# Patient Record
Sex: Male | Born: 1990 | Race: Black or African American | Hispanic: No | Marital: Single | Smoking: Current every day smoker
Health system: Southern US, Community
[De-identification: ages and names within clinical notes are randomized; demographics above are authoritative.]

---
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
## 2012-08-14 ENCOUNTER — Emergency Department (HOSPITAL_COMMUNITY)
Admission: EM | Admit: 2012-08-14 | Discharge: 2012-08-14 | Disposition: A | Discharge status: Home / Self Care | Payer: Self-pay | Attending: Emergency Medicine | Admitting: Emergency Medicine

## 2012-08-14 ENCOUNTER — Emergency Department (HOSPITAL_COMMUNITY): Payer: Self-pay

## 2012-08-14 ENCOUNTER — Encounter (HOSPITAL_COMMUNITY): Payer: Self-pay | Admitting: Emergency Medicine

## 2012-08-14 DIAGNOSIS — Z79899 Other long term (current) drug therapy: Secondary | ICD-10-CM | POA: Insufficient documentation

## 2012-08-14 DIAGNOSIS — Y929 Unspecified place or not applicable: Secondary | ICD-10-CM | POA: Insufficient documentation

## 2012-08-14 DIAGNOSIS — Y9367 Activity, basketball: Secondary | ICD-10-CM | POA: Insufficient documentation

## 2012-08-14 DIAGNOSIS — S92302A Fracture of unspecified metatarsal bone(s), left foot, initial encounter for closed fracture: Secondary | ICD-10-CM

## 2012-08-14 DIAGNOSIS — X500XXA Overexertion from strenuous movement or load, initial encounter: Secondary | ICD-10-CM | POA: Insufficient documentation

## 2012-08-14 DIAGNOSIS — S92309A Fracture of unspecified metatarsal bone(s), unspecified foot, initial encounter for closed fracture: Secondary | ICD-10-CM | POA: Insufficient documentation

## 2012-08-14 DIAGNOSIS — F17200 Nicotine dependence, unspecified, uncomplicated: Secondary | ICD-10-CM | POA: Insufficient documentation

## 2012-08-14 MED ORDER — OXYCODONE-ACETAMINOPHEN 5-325 MG PO TABS: 2.0000 | ORAL_TABLET | Freq: Four times a day (QID) | ORAL | Status: AC | PRN

## 2012-08-14 MED ORDER — ONDANSETRON 8 MG PO TBDP
8.0000 mg | ORAL_TABLET | Freq: Once | ORAL | Status: AC
Start: 2012-08-14 — End: 2012-08-14
  Administered 2012-08-14: 8 mg via ORAL
  Filled 2012-08-14: qty 1

## 2012-08-14 MED ORDER — PROMETHAZINE HCL 25 MG PO TABS: 25.0000 mg | ORAL_TABLET | Freq: Four times a day (QID) | ORAL | Status: AC | PRN

## 2012-08-14 MED ORDER — OXYCODONE-ACETAMINOPHEN 5-325 MG PO TABS
2.0000 | ORAL_TABLET | Freq: Once | ORAL | Status: AC
Start: 2012-08-14 — End: 2012-08-14
  Administered 2012-08-14: 2 via ORAL
  Filled 2012-08-14: qty 2

## 2012-08-14 NOTE — ED Provider Notes (Signed)
Medical screening examination/treatment/procedure(s) were performed by non-physician practitioner and as supervising physician I was immediately available for consultation/collaboration.  Cesareo Vickrey, MD 08/14/12 2323 

## 2012-08-14 NOTE — ED Provider Notes (Signed)
History    This chart was scribed for Junious Silk, PA working with Geoffery Lyons, MD by ED Scribe, Burman Nieves. This patient was seen in room WTR6/WTR6 and the patient's care was started at 6:46 PM.   CSN: [id number]  Arrival date & time 08/14/12  2130   First MD Initiated Contact with Patient 08/14/12 1846      Chief Complaint  Patient presents with  . Foot Pain    (Consider location/radiation/quality/duration/timing/severity/associated sxs/prior treatment) Patient is a 22 y.o. male presenting with lower extremity pain. The history is provided by the patient. No language interpreter was used.  Foot Pain   HPI Comments: Micheal Christensen is a 22 y.o. male who presents to the Emergency Department complaining of moderate constant right foot pain with associated swelling which occurred yesterday. Pt was playing basketball yesterday when he was attempting a crossover when another person stepped on his right foot causing him to twist it. Pt woke up this morning with progressing swelling and pain. Pt states he heard a pop and pain is exacerbated upon ambulation. There is evident swelling in his right foot and any weight bearing activity exacerbates pain as well. Pt denies fever, chills, cough, nausea, vomiting, diarrhea, SOB, weakness, and any other associated symptoms. Pt states he is a current tobacco smoker. Pt's current PCP is Dr. Cristal Ford.   History reviewed. No pertinent past medical history.  History reviewed. No pertinent past surgical history.  No family history on file.  History  Substance Use Topics  . Smoking status: Current Every Day Smoker  . Smokeless tobacco: Not on file  . Alcohol Use: Yes      Review of Systems  Musculoskeletal: Positive for myalgias, joint swelling and arthralgias.  All other systems reviewed and are negative.    Allergies  Review of patient's allergies indicates no known allergies.  Home Medications   Current Outpatient Rx  Name  Route   Sig  Dispense  Refill  . methylphenidate (CONCERTA) 36 MG CR tablet   Oral   Take 36 mg by mouth daily.           . methylphenidate (RITALIN) 10 MG tablet   Oral   Take 10 mg by mouth daily at 12 noon.             BP 132/67  Temp(Src) 98.2 F (36.8 C)  Resp 18  SpO2 98%  Physical Exam  Nursing note and vitals reviewed. Constitutional: He is oriented to person, place, and time. He appears well-developed and well-nourished. No distress.  HENT:  Head: Normocephalic and atraumatic.  Right Ear: External ear normal.  Left Ear: External ear normal.  Nose: Nose normal.  Eyes: Conjunctivae are normal. Pupils are equal, round, and reactive to light.  Neck: Normal range of motion. No tracheal deviation present.  Cardiovascular: Normal rate, regular rhythm and normal heart sounds.   Pulmonary/Chest: Effort normal and breath sounds normal. No stridor.  Abdominal: Soft. He exhibits no distension. There is no tenderness.  Musculoskeletal: Normal range of motion. He exhibits tenderness.  Small effusion on the lateral side of his right foot. Tenderness to palpation to the lateral side of his right foot. Neurovascularly intact.   Neurological: He is alert and oriented to person, place, and time.  Skin: Skin is warm and dry. He is not diaphoretic.  Psychiatric: He has a normal mood and affect. His behavior is normal.    ED Course  Procedures (including critical care time) DIAGNOSTIC STUDIES: Oxygen Saturation  is 98% on room air, normal by my interpretation.    COORDINATION OF CARE:  7:45 PM Discussed ED treatment with pt and pt agrees. Advised pt to contact current PCP for further evaluation of broken foot.     Labs Reviewed - No data to display Dg Foot Complete Right  08/14/2012   *RADIOLOGY REPORT*  Clinical Data: Foot pain.  Stepped on 1 day ago.  RIGHT FOOT COMPLETE - 3+ VIEW  Comparison: None.  Findings: There is an acute, incomplete fracture through the lateral cortex of the  proximal shaft of the fifth metatarsal. There is adjacent soft tissue swelling.  The joints of the foot are aligned.  No additional fractures are identified.  IMPRESSION: Nondisplaced and incomplete fracture of the proximal shaft of the fifth metatarsal, with adjacent soft tissue swelling.   Original Report Authenticated By: Britta Mccreedy, M.D.     1. Fracture of fifth metatarsal bone, left, closed, initial encounter       MDM  Patient presents with an incomplete fracture of the fifth metatarsal bone. He was given crutches and a post op shoe. Follow up with your pcp to ensure the bone is healing. Work note given per patient request. Compartments soft, neurovascularly intact. Return precautions given. Vital signs stable for discharge. Patient / Family / Caregiver informed of clinical course, understand medical decision-making process, and agree with plan.      I personally performed the services described in this documentation, which was scribed in my presence. The recorded information has been reviewed and is accurate.     Mora Bellman, PA-C 08/14/12 2300

## 2012-08-14 NOTE — ED Notes (Signed)
Per pt, was playing basketball, someone stepped on foot-woke up this am with increased pain and swelling-unable to bear weight

## 2014-02-15 ENCOUNTER — Encounter (HOSPITAL_COMMUNITY): Payer: Self-pay | Admitting: Emergency Medicine

## 2014-02-15 ENCOUNTER — Emergency Department (HOSPITAL_COMMUNITY): Payer: Medicaid Other

## 2014-02-15 ENCOUNTER — Emergency Department (HOSPITAL_COMMUNITY)
Admission: EM | Admit: 2014-02-15 | Discharge: 2014-02-15 | Disposition: A | Discharge status: Home / Self Care | Payer: Medicaid Other | Attending: Emergency Medicine | Admitting: Emergency Medicine

## 2014-02-15 DIAGNOSIS — S8990XA Unspecified injury of unspecified lower leg, initial encounter: Secondary | ICD-10-CM

## 2014-02-15 DIAGNOSIS — Y92410 Unspecified street and highway as the place of occurrence of the external cause: Secondary | ICD-10-CM | POA: Insufficient documentation

## 2014-02-15 DIAGNOSIS — Z720 Tobacco use: Secondary | ICD-10-CM | POA: Insufficient documentation

## 2014-02-15 DIAGNOSIS — Y9389 Activity, other specified: Secondary | ICD-10-CM | POA: Insufficient documentation

## 2014-02-15 DIAGNOSIS — S83511A Sprain of anterior cruciate ligament of right knee, initial encounter: Secondary | ICD-10-CM | POA: Insufficient documentation

## 2014-02-15 DIAGNOSIS — S8391XA Sprain of unspecified site of right knee, initial encounter: Secondary | ICD-10-CM

## 2014-02-15 DIAGNOSIS — Y998 Other external cause status: Secondary | ICD-10-CM | POA: Insufficient documentation

## 2014-02-15 NOTE — Discharge Instructions (Signed)
Please call your doctor for a followup appointment within 24-48 hours. When you talk to your doctor please let them know that you were seen in the emergency department and have them acquire all of your records so that they can discuss the findings with you and formulate a treatment plan to fully care for your new and ongoing problems. Please call and set-up an appointment with your primary care provider to be seen and reassessed Please call and set up an appointment with orthopedics Please apply knee sleeve and keep on at all times Please rest, ice, elevate - toes above nose Please massage Please continue to monitor symptoms closely and if symptoms are to worsen or change (fever greater than 101, chills, sweating, nausea, vomiting, chest pain, shortness of breathe, difficulty breathing, weakness, numbness, tingling, worsening or changes to pain pattern, Fall, injury, swelling, redness, red streaks, loss of sensation, weakness to the leg) please report back to the Emergency Department immediately.   Knee Pain The knee is the complex joint between your thigh and your lower leg. It is made up of bones, tendons, ligaments, and cartilage. The bones that make up the knee are:  The femur in the thigh.  The tibia and fibula in the lower leg.  The patella or kneecap riding in the groove on the lower femur. CAUSES  Knee pain is a common complaint with many causes. A few of these causes are:  Injury, such as:  A ruptured ligament or tendon injury.  Torn cartilage.  Medical conditions, such as:  Gout  Arthritis  Infections  Overuse, over training, or overdoing a physical activity. Knee pain can be minor or severe. Knee pain can accompany debilitating injury. Minor knee problems often respond well to self-care measures or get well on their own. More serious injuries may need medical intervention or even surgery. SYMPTOMS The knee is complex. Symptoms of knee problems can vary widely. Some of  the problems are:  Pain with movement and weight bearing.  Swelling and tenderness.  Buckling of the knee.  Inability to straighten or extend your knee.  Your knee locks and you cannot straighten it.  Warmth and redness with pain and fever.  Deformity or dislocation of the kneecap. DIAGNOSIS  Determining what is wrong may be very straight forward such as when there is an injury. It can also be challenging because of the complexity of the knee. Tests to make a diagnosis may include:  Your caregiver taking a history and doing a physical exam.  Routine X-rays can be used to rule out other problems. X-rays will not reveal a cartilage tear. Some injuries of the knee can be diagnosed by:  Arthroscopy a surgical technique by which a small video camera is inserted through tiny incisions on the sides of the knee. This procedure is used to examine and repair internal knee joint problems. Tiny instruments can be used during arthroscopy to repair the torn knee cartilage (meniscus).  Arthrography is a radiology technique. A contrast liquid is directly injected into the knee joint. Internal structures of the knee joint then become visible on X-ray film.  An MRI scan is a non X-ray radiology procedure in which magnetic fields and a computer produce two- or three-dimensional images of the inside of the knee. Cartilage tears are often visible using an MRI scanner. MRI scans have largely replaced arthrography in diagnosing cartilage tears of the knee.  Blood work.  Examination of the fluid that helps to lubricate the knee joint (synovial fluid).  This is done by taking a sample out using a needle and a syringe. TREATMENT The treatment of knee problems depends on the cause. Some of these treatments are:  Depending on the injury, proper casting, splinting, surgery, or physical therapy care will be needed.  Give yourself adequate recovery time. Do not overuse your joints. If you begin to get sore  during workout routines, back off. Slow down or do fewer repetitions.  For repetitive activities such as cycling or running, maintain your strength and nutrition.  Alternate muscle groups. For example, if you are a weight lifter, work the upper body on one day and the lower body the next.  Either tight or weak muscles do not give the proper support for your knee. Tight or weak muscles do not absorb the stress placed on the knee joint. Keep the muscles surrounding the knee strong.  Take care of mechanical problems.  If you have flat feet, orthotics or special shoes may help. See your caregiver if you need help.  Arch supports, sometimes with wedges on the inner or outer aspect of the heel, can help. These can shift pressure away from the side of the knee most bothered by osteoarthritis.  A brace called an "unloader" brace also may be used to help ease the pressure on the most arthritic side of the knee.  If your caregiver has prescribed crutches, braces, wraps or ice, use as directed. The acronym for this is PRICE. This means protection, rest, ice, compression, and elevation.  Nonsteroidal anti-inflammatory drugs (NSAIDs), can help relieve pain. But if taken immediately after an injury, they may actually increase swelling. Take NSAIDs with food in your stomach. Stop them if you develop stomach problems. Do not take these if you have a history of ulcers, stomach pain, or bleeding from the bowel. Do not take without your caregiver's approval if you have problems with fluid retention, heart failure, or kidney problems.  For ongoing knee problems, physical therapy may be helpful.  Glucosamine and chondroitin are over-the-counter dietary supplements. Both may help relieve the pain of osteoarthritis in the knee. These medicines are different from the usual anti-inflammatory drugs. Glucosamine may decrease the rate of cartilage destruction.  Injections of a corticosteroid drug into your knee joint  may help reduce the symptoms of an arthritis flare-up. They may provide pain relief that lasts a few months. You may have to wait a few months between injections. The injections do have a small increased risk of infection, water retention, and elevated blood sugar levels.  Hyaluronic acid injected into damaged joints may ease pain and provide lubrication. These injections may work by reducing inflammation. A series of shots may give relief for as long as 6 months.  Topical painkillers. Applying certain ointments to your skin may help relieve the pain and stiffness of osteoarthritis. Ask your pharmacist for suggestions. Many over the-counter products are approved for temporary relief of arthritis pain.  In some countries, doctors often prescribe topical NSAIDs for relief of chronic conditions such as arthritis and tendinitis. A review of treatment with NSAID creams found that they worked as well as oral medications but without the serious side effects. PREVENTION  Maintain a healthy weight. Extra pounds put more strain on your joints.  Get strong, stay limber. Weak muscles are a common cause of knee injuries. Stretching is important. Include flexibility exercises in your workouts.  Be smart about exercise. If you have osteoarthritis, chronic knee pain or recurring injuries, you may need to change the  way you exercise. This does not mean you have to stop being active. If your knees ache after jogging or playing basketball, consider switching to swimming, water aerobics, or other low-impact activities, at least for a few days a week. Sometimes limiting high-impact activities will provide relief.  Make sure your shoes fit well. Choose footwear that is right for your sport.  Protect your knees. Use the proper gear for knee-sensitive activities. Use kneepads when playing volleyball or laying carpet. Buckle your seat belt every time you drive. Most shattered kneecaps occur in car accidents.  Rest when  you are tired. SEEK MEDICAL CARE IF:  You have knee pain that is continual and does not seem to be getting better.  SEEK IMMEDIATE MEDICAL CARE IF:  Your knee joint feels hot to the touch and you have a high fever. MAKE SURE YOU:   Understand these instructions.  Will watch your condition.  Will get help right away if you are not doing well or get worse. Document Released: 01/13/2007 Document Revised: 06/10/2011 Document Reviewed: 01/13/2007 [facility] Patient Information 2015 [employer], [place]. This information is not intended to replace advice given to you by your health care provider. Make sure you discuss any questions you have with your health care provider.

## 2014-02-15 NOTE — ED Notes (Signed)
EDPA Marissa at bedside. 

## 2014-02-15 NOTE — ED Notes (Signed)
Pt d/c home with all belongings, pt alert, oriented and ambulatory, no new Rx prescribed, pt driven home by mother at bedside
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
## 2017-11-28 ENCOUNTER — Telehealth (HOSPITAL_COMMUNITY): Payer: Self-pay | Admitting: *Deleted

## 2017-12-11 ENCOUNTER — Inpatient Hospital Stay (HOSPITAL_COMMUNITY): Admission: RE | Admit: 2017-12-11 | Payer: Self-pay | Source: Ambulatory Visit | Admitting: Obstetrics & Gynecology
